# Patient Record
Sex: Male | Born: 1985 | Race: Black or African American | Hispanic: No | Marital: Single | Smoking: Current some day smoker
Health system: Southern US, Community
[De-identification: ages and names within clinical notes are randomized; demographics above are authoritative.]

---

## 2015-09-27 ENCOUNTER — Emergency Department
Admission: EM | Admit: 2015-09-27 | Discharge: 2015-09-27 | Disposition: A | Discharge status: Home / Self Care | Payer: Self-pay | Attending: Emergency Medicine | Admitting: Emergency Medicine

## 2015-09-27 ENCOUNTER — Emergency Department: Payer: Self-pay

## 2015-09-27 ENCOUNTER — Encounter: Payer: Self-pay | Admitting: Emergency Medicine

## 2015-09-27 DIAGNOSIS — F17200 Nicotine dependence, unspecified, uncomplicated: Secondary | ICD-10-CM | POA: Insufficient documentation

## 2015-09-27 DIAGNOSIS — R079 Chest pain, unspecified: Secondary | ICD-10-CM | POA: Insufficient documentation

## 2015-09-27 LAB — COMPREHENSIVE METABOLIC PANEL
ALK PHOS: 75 U/L (ref 38–126)
ALT: 29 U/L (ref 17–63)
AST: 22 U/L (ref 15–41)
Albumin: 3.8 g/dL (ref 3.5–5.0)
Anion gap: 6 (ref 5–15)
BUN: 9 mg/dL (ref 6–20)
CALCIUM: 9.2 mg/dL (ref 8.9–10.3)
CHLORIDE: 101 mmol/L (ref 101–111)
CO2: 29 mmol/L (ref 22–32)
CREATININE: 0.7 mg/dL (ref 0.61–1.24)
Glucose, Bld: 111 mg/dL — ABNORMAL HIGH (ref 65–99)
Potassium: 3.9 mmol/L (ref 3.5–5.1)
Sodium: 136 mmol/L (ref 135–145)
TOTAL PROTEIN: 8.1 g/dL (ref 6.5–8.1)
Total Bilirubin: 0.2 mg/dL — ABNORMAL LOW (ref 0.3–1.2)

## 2015-09-27 LAB — CBC
HCT: 47.9 % (ref 40.0–52.0)
HEMOGLOBIN: 15.4 g/dL (ref 13.0–18.0)
MCH: 26.6 pg (ref 26.0–34.0)
MCHC: 32.1 g/dL (ref 32.0–36.0)
MCV: 82.6 fL (ref 80.0–100.0)
Platelets: 371 10*3/uL (ref 150–440)
RBC: 5.79 MIL/uL (ref 4.40–5.90)
RDW: 14.8 % — ABNORMAL HIGH (ref 11.5–14.5)
WBC: 16.9 10*3/uL — AB (ref 3.8–10.6)

## 2015-09-27 LAB — URINALYSIS COMPLETE WITH MICROSCOPIC (ARMC ONLY)
Bacteria, UA: NONE SEEN
Bilirubin Urine: NEGATIVE
Glucose, UA: NEGATIVE mg/dL
KETONES UR: NEGATIVE mg/dL
Leukocytes, UA: NEGATIVE
NITRITE: NEGATIVE
PROTEIN: NEGATIVE mg/dL
SPECIFIC GRAVITY, URINE: 1.019 (ref 1.005–1.030)
pH: 5 (ref 5.0–8.0)

## 2015-09-27 LAB — FIBRIN DERIVATIVES D-DIMER (ARMC ONLY): FIBRIN DERIVATIVES D-DIMER (ARMC): 565 — AB (ref 0–499)

## 2015-09-27 LAB — LIPASE, BLOOD: LIPASE: 25 U/L (ref 11–51)

## 2015-09-27 MED ORDER — SODIUM CHLORIDE 0.9 % IV BOLUS (SEPSIS)
1000.0000 mL | Freq: Once | INTRAVENOUS | Status: AC
  Administered 2015-09-27: 1000 mL via INTRAVENOUS

## 2015-09-27 MED ORDER — IBUPROFEN 600 MG PO TABS: 600.0000 mg | ORAL_TABLET | Freq: Three times a day (TID) | ORAL | Status: AC | PRN

## 2015-09-27 MED ORDER — IOHEXOL 350 MG/ML SOLN
75.0000 mL | Freq: Once | INTRAVENOUS | Status: AC | PRN
  Administered 2015-09-27: 75 mL via INTRAVENOUS
  Filled 2015-09-27: qty 75

## 2015-09-27 NOTE — ED Notes (Signed)
C/o right lateral to ruq abd pain past several days

## 2015-09-27 NOTE — ED Provider Notes (Signed)
[facility] Emergency Department Provider Note  ____________________________________________  Time seen: 1900  I have reviewed the triage vital signs and the nursing notes.   HISTORY  Chief Complaint Flank Pain   History limited by: Not Limited   HPI Andre KnackRobert Warren Klees Jr. is a 29 y.o. male who presents to the emergency department today because of concerns for right lower chest pain. The patient states that the pain has been going on for roughly 2 weeks. He denies any trauma before the start of the pain. He states that the pain does sort of come and go between days. He states that the pain is not quite sharp in nature. It is only in that one area. It does not radiate. He states that he has never had pain like this before. He denies any productive cough. Denies any fevers.   History reviewed. No pertinent past medical history.  There are no active problems to display for this patient.   History reviewed. No pertinent past surgical history.  No current outpatient prescriptions on file.  Allergies Review of patient's allergies indicates no known allergies.  No family history on file.  Social History Social History  Substance Use Topics  . Smoking status: Current Some Day Smoker  . Smokeless tobacco: None  . Alcohol Use: No    Review of Systems  Constitutional: Negative for fever. Cardiovascular: Positive for right lower chest pain Respiratory: Negative for shortness of breath. Gastrointestinal: Negative for abdominal pain, vomiting and diarrhea. Genitourinary: Negative for dysuria. Musculoskeletal: Negative for back pain. Skin: Negative for rash. Neurological: Negative for headaches, focal weakness or numbness.  10-point ROS otherwise negative.  ____________________________________________   PHYSICAL EXAM:  VITAL SIGNS: ED Triage Vitals  Enc Vitals Group     BP 09/27/15 1732 157/107 mmHg     Pulse Rate 09/27/15 1732 120     Resp  09/27/15 1732 15     Temp 09/27/15 1732 98.7 F (37.1 C)     Temp Source 09/27/15 1732 Oral     SpO2 09/27/15 1732 98 %     Weight 09/27/15 1732 302 lb (136.986 kg)     Height 09/27/15 1732 5\' 7"  (1.702 m)     Head Cir --      Peak Flow --      Pain Score 09/27/15 1746 3   Constitutional: Alert and oriented. Well appearing and in no distress. Eyes: Conjunctivae are normal. PERRL. Normal extraocular movements. ENT   Head: Normocephalic and atraumatic.   Nose: No congestion/rhinnorhea.   Mouth/Throat: Mucous membranes are moist.   Neck: No stridor. Hematological/Lymphatic/Immunilogical: No cervical lymphadenopathy. Cardiovascular: Normal rate, regular rhythm.  No murmurs, rubs, or gallops. Respiratory: Normal respiratory effort without tachypnea nor retractions. Breath sounds are clear and equal bilaterally. No wheezes/rales/rhonchi. Gastrointestinal: Soft and nontender. No distention. There is no CVA tenderness. Genitourinary: Deferred Musculoskeletal: Normal range of motion in all extremities. No joint effusions.  No lower extremity tenderness nor edema. Neurologic:  Normal speech and language. No gross focal neurologic deficits are appreciated.  Skin:  Skin is warm, dry and intact. No rash noted. Psychiatric: Mood and affect are normal. Speech and behavior are normal. Patient exhibits appropriate insight and judgment.  ____________________________________________    LABS (pertinent positives/negatives)  Labs Reviewed  CBC - Abnormal; Notable for the following:    WBC 16.9 (*)    RDW 14.8 (*)    All other components within normal limits  URINALYSIS COMPLETEWITH MICROSCOPIC ([facility] ONLY) - Abnormal; Notable  for the following:    Color, Urine YELLOW (*)    APPearance CLEAR (*)    Hgb urine dipstick 1+ (*)    Squamous Epithelial / LPF 0-5 (*)    All other components within normal limits  COMPREHENSIVE METABOLIC PANEL - Abnormal; Notable for the following:     Glucose, Bld 111 (*)    Total Bilirubin 0.2 (*)    All other components within normal limits  FIBRIN DERIVATIVES D-DIMER ([facility] ONLY) - Abnormal; Notable for the following:    Fibrin derivatives D-dimer ([facility]) 565 (*)    All other components within normal limits  LIPASE, BLOOD     ____________________________________________   EKG  I, Phineas Semen, attending physician, personally viewed and interpreted this EKG  EKG Time: 1739 Rate: 117 Rhythm: sinus tachycardia Axis: normal Intervals: qtc 446 QRS: narrow ST changes: no st elevation Impression: no st elevation ____________________________________________    RADIOLOGY  CT angios chest IMPRESSION: 1. No evidence of significant pulmonary embolus. 2. Lungs essentially clear bilaterally.  ____________________________________________   PROCEDURES  Procedure(s) performed: None  Critical Care performed: No  ____________________________________________   INITIAL IMPRESSION / ASSESSMENT AND PLAN / ED COURSE  Pertinent labs & imaging results that were available during my care of the patient were reviewed by me and considered in my medical decision making (see chart for details).  Patient presented to the emergency department today because of concerns for right lower chest pain. It was pleuritic. I did order a d-dimer with my concern for pulmonary embolism however the CT in general was negative. This point unclear etiology of the pain however given that it is pleuritic I do wonder pleurisy as a component. Patient was completely nontender in the abdomen LFTs and lipase within normal limits, I doubt gallbladder disease. I did discuss with patient that I would like him to be evaluated tomorrow.  ____________________________________________   FINAL CLINICAL IMPRESSION(S) / ED DIAGNOSES  Final diagnoses:  Right-sided chest pain     Phineas Semen, MD 09/28/15 1807

## 2015-09-27 NOTE — Discharge Instructions (Signed)
Please seek medical attention for any high fevers, chest pain, shortness of breath, change in behavior, persistent vomiting, bloody stool or any other new or concerning symptoms. ° ° °Pleurisy °Pleurisy is an inflammation and swelling of the lining of the lungs (pleura). Because of this inflammation, it hurts to breathe. It can be aggravated by coughing, laughing, or deep breathing. Pleurisy is often caused by an underlying infection or disease.  °HOME CARE INSTRUCTIONS  °Monitor your pleurisy for any changes. The following actions may help to alleviate any discomfort you are experiencing: °· Medicine may help with pain. Only take over-the-counter or prescription medicines for pain, discomfort, or fever as directed by your health care provider. °· Only take antibiotic medicine as directed. Make sure to finish it even if you start to feel better. °SEEK MEDICAL CARE IF:  °· Your pain is not controlled with medicine or is increasing. °· You have an increase in pus-like (purulent) secretions brought up with coughing. °SEEK IMMEDIATE MEDICAL CARE IF:  °· You have blue or dark lips, fingernails, or toenails. °· You are coughing up blood. °· You have increased difficulty breathing. °· You have continuing pain unrelieved by medicine or pain lasting more than 1 week. °· You have pain that radiates into your neck, arms, or jaw. °· You develop increased shortness of breath or wheezing. °· You develop a fever, rash, vomiting, fainting, or other serious symptoms. °MAKE SURE YOU: °· Understand these instructions.   °· Will watch your condition.   °· Will get help right away if you are not doing well or get worse. °  °  °This information is not intended to replace advice given to you by your health care provider. Make sure you discuss any questions you have with your health care provider. °  °Document Released: 10/30/2005 Document Revised: 07/02/2013 Document Reviewed: 04/13/2013 °Elsevier Interactive Patient Education ©2016  [employer] ° °

## 2015-09-27 NOTE — ED Notes (Signed)
Pt presents with right side flank pain for two weeks now with no other symptoms.

## 2016-06-19 IMAGING — CT CT ANGIO CHEST
1 of 2 series · 18 of 30 positions shown · IV contrast (APPLIED)
Comparison: Chest radiograph performed earlier today at [DATE] p.m.

CLINICAL DATA: Subacute onset of right-sided chest pain, worse with
cough or deep breathing. Initial encounter.

EXAM:
CT ANGIOGRAPHY CHEST WITH CONTRAST
TECHNIQUE: Multidetector CT imaging of the chest was performed using the
standard protocol during bolus administration of intravenous
contrast. Multiplanar CT image reconstructions and MIPs were
obtained to evaluate the vascular anatomy.
CONTRAST:  75mL OMNIPAQUE IOHEXOL 350 MG/ML SOLN

[Series 5: pe 1.0 thins · axial · 0.76mm/px · z∈[-354,-98]mm · 18 of 288 slices shown]
[im 16/288  lung]
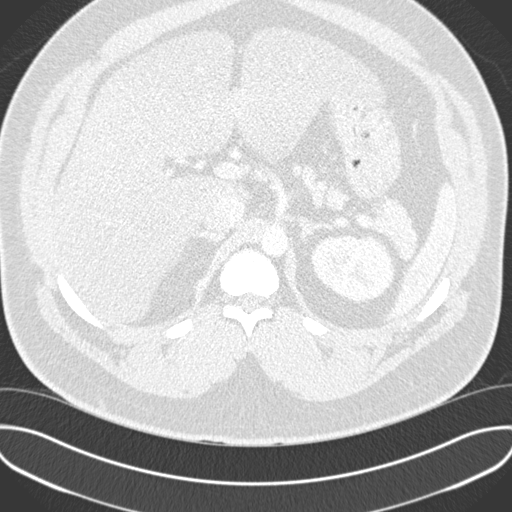
[im 32/288  mediastinal]
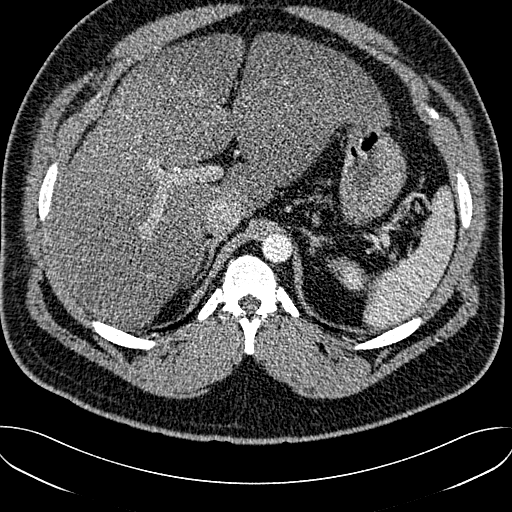
[im 48/288  lung]
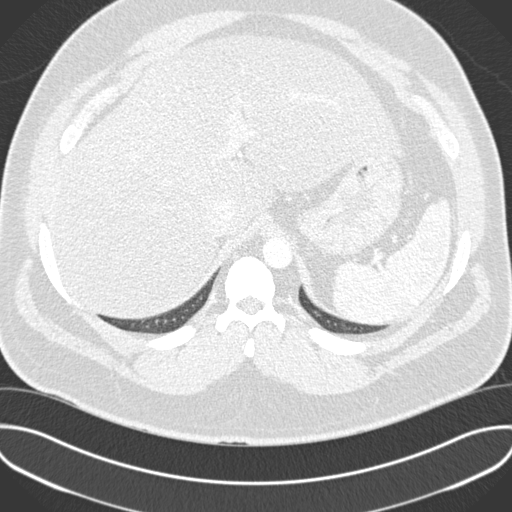
[im 64/288  mediastinal]
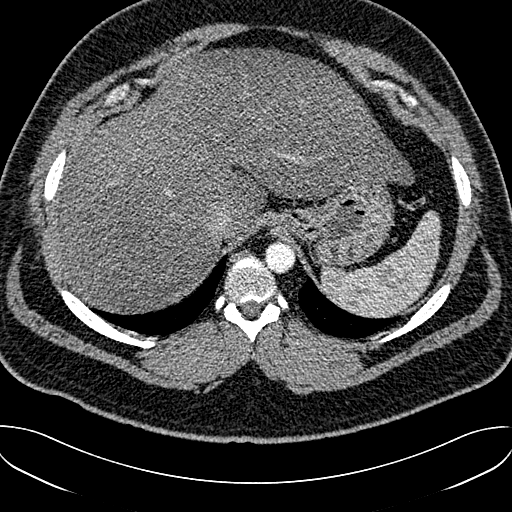
[im 80/288  lung]
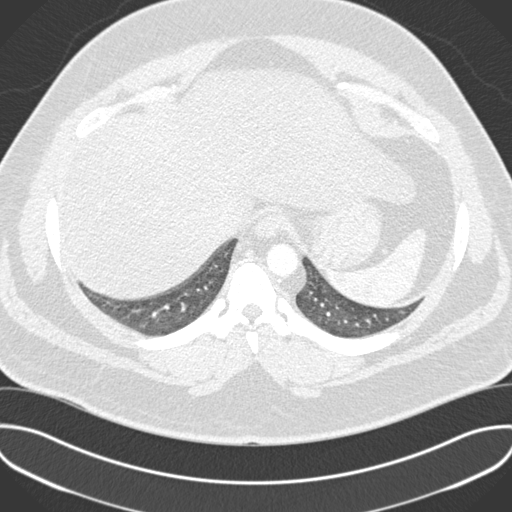
[im 96/288  mediastinal]
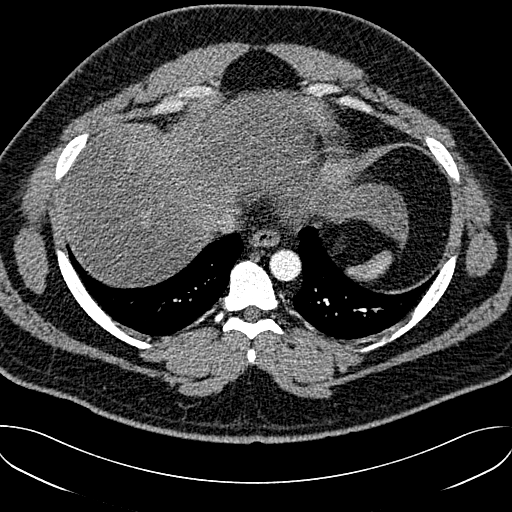
[im 112/288  lung]
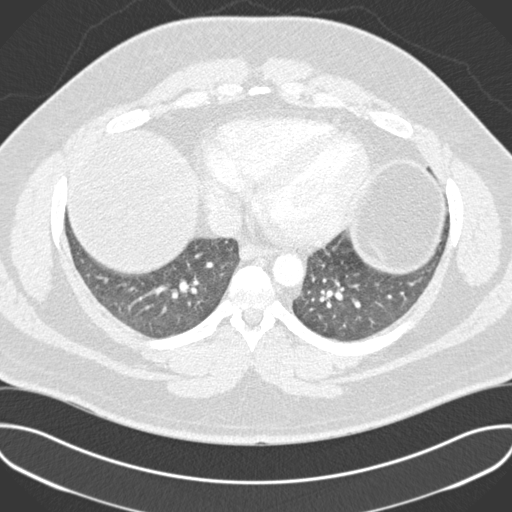
[im 128/288  mediastinal]
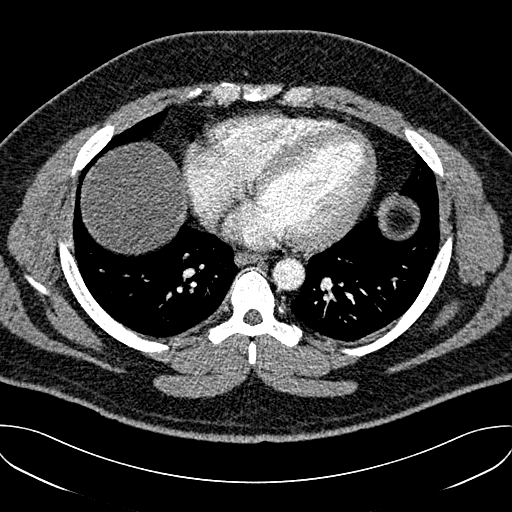
[im 135/288  lung]
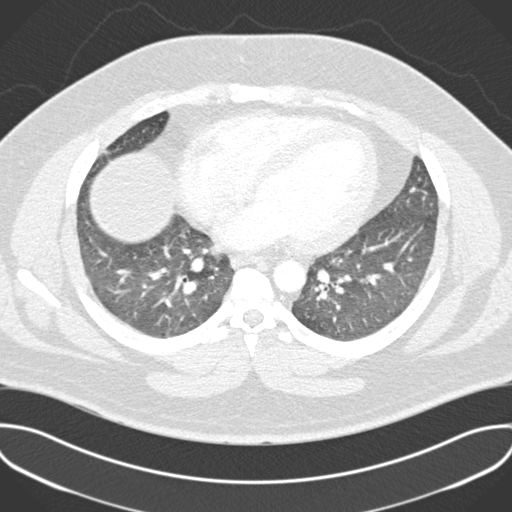
[im 144/288  mediastinal]
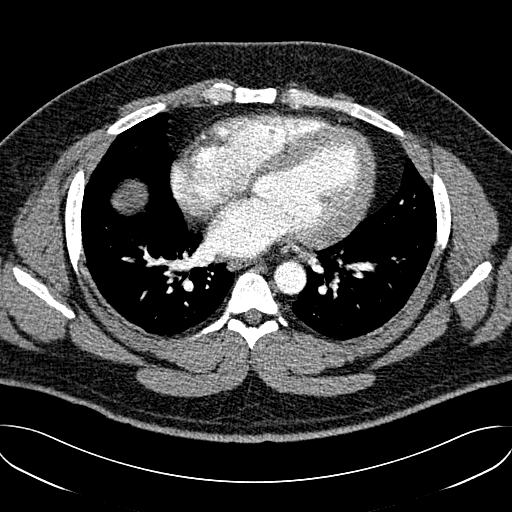
[im 160/288  lung]
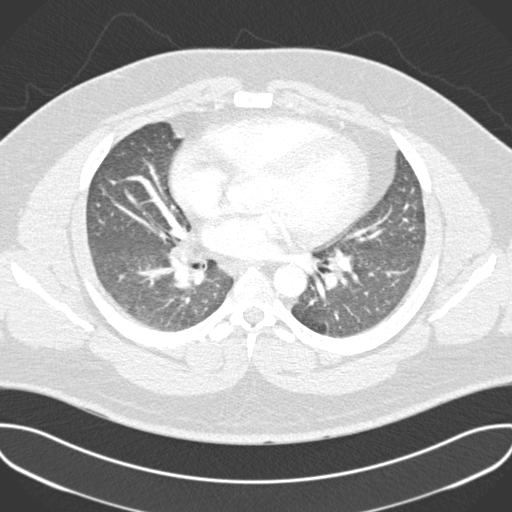
[im 176/288  mediastinal]
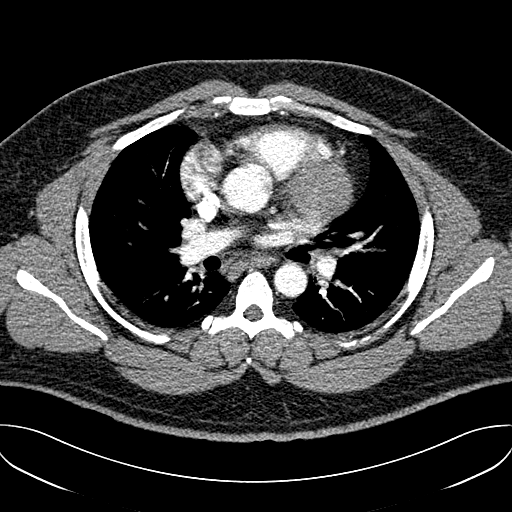
[im 192/288  lung]
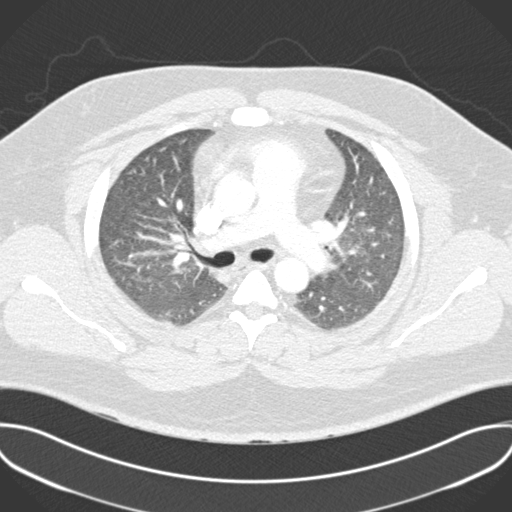
[im 208/288  mediastinal]
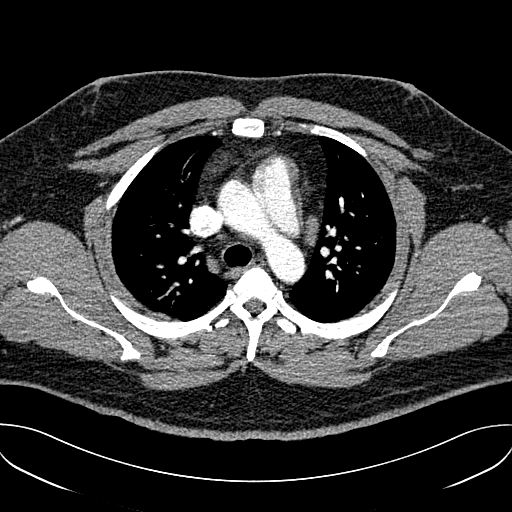
[im 224/288  lung]
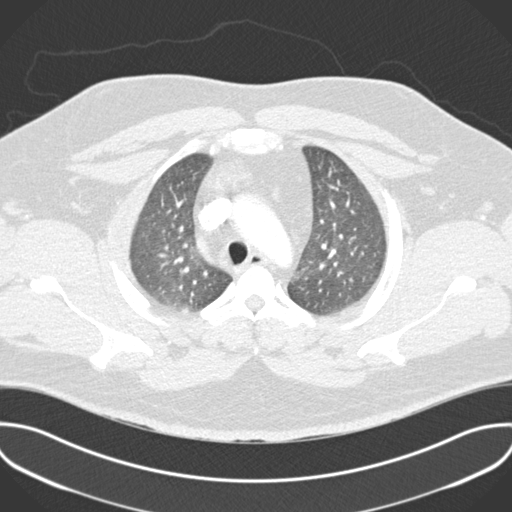
[im 240/288  mediastinal]
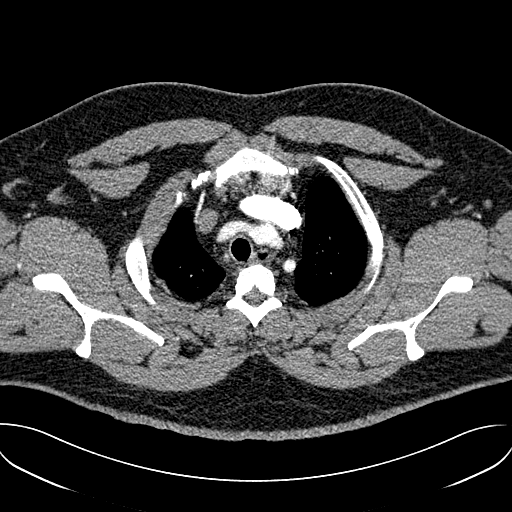
[im 256/288  lung]
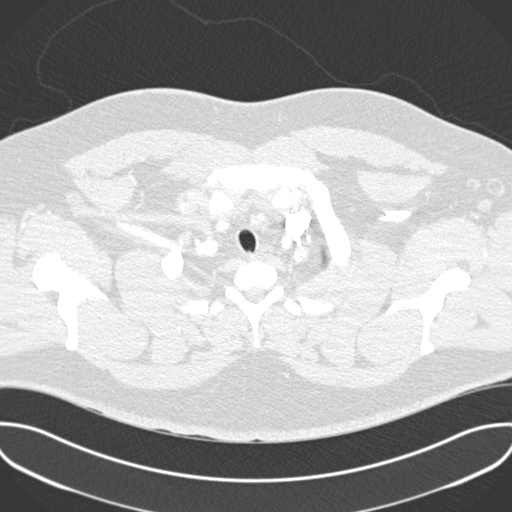
[im 272/288  mediastinal]
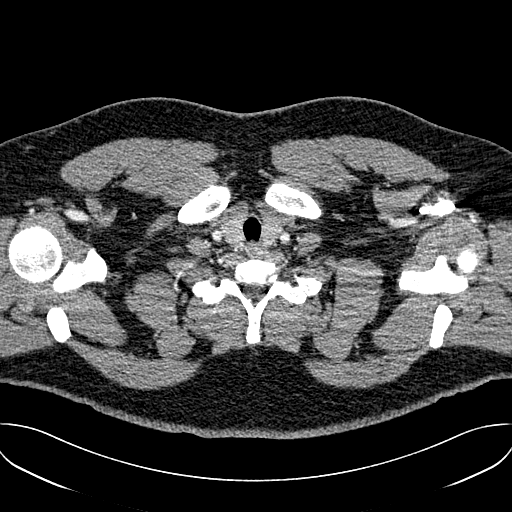

[18 of 30 positions shown; findings below may reference images not displayed]

FINDINGS: There is no evidence of significant pulmonary embolus.

The lungs are essentially clear bilaterally. There is no evidence of
significant focal consolidation, pleural effusion or pneumothorax.
No masses are identified; no abnormal focal contrast enhancement is
seen.

The mediastinum is unremarkable in appearance. No mediastinal
lymphadenopathy is seen. No pericardial effusion is identified. The
great vessels are grossly unremarkable in appearance. No axillary
lymphadenopathy is seen. The visualized portions of the thyroid
gland are unremarkable in appearance.

The visualized portions of the liver and spleen are unremarkable.
The visualized portions of the pancreas, stomach, adrenal glands and
kidneys are within normal limits.

No acute osseous abnormalities are seen.

Review of the MIP images confirms the above findings.
IMPRESSION: 1. No evidence of significant pulmonary embolus.
2. Lungs essentially clear bilaterally.

## 2018-03-19 ENCOUNTER — Emergency Department: Payer: Self-pay

## 2018-03-19 ENCOUNTER — Encounter: Payer: Self-pay | Admitting: Emergency Medicine

## 2018-03-19 ENCOUNTER — Other Ambulatory Visit: Payer: Self-pay

## 2018-03-19 ENCOUNTER — Emergency Department
Admission: EM | Admit: 2018-03-19 | Discharge: 2018-03-19 | Disposition: A | Discharge status: Home / Self Care | Payer: Self-pay | Attending: Student in an Organized Health Care Education/Training Program | Admitting: Student in an Organized Health Care Education/Training Program

## 2018-03-19 DIAGNOSIS — Y9383 Activity, rough housing and horseplay: Secondary | ICD-10-CM | POA: Insufficient documentation

## 2018-03-19 DIAGNOSIS — Y998 Other external cause status: Secondary | ICD-10-CM | POA: Insufficient documentation

## 2018-03-19 DIAGNOSIS — Y929 Unspecified place or not applicable: Secondary | ICD-10-CM | POA: Insufficient documentation

## 2018-03-19 DIAGNOSIS — W228XXA Striking against or struck by other objects, initial encounter: Secondary | ICD-10-CM | POA: Insufficient documentation

## 2018-03-19 DIAGNOSIS — F17200 Nicotine dependence, unspecified, uncomplicated: Secondary | ICD-10-CM | POA: Insufficient documentation

## 2018-03-19 DIAGNOSIS — S62310A Displaced fracture of base of second metacarpal bone, right hand, initial encounter for closed fracture: Secondary | ICD-10-CM | POA: Insufficient documentation

## 2018-03-19 MED ORDER — OXYCODONE-ACETAMINOPHEN 7.5-325 MG PO TABS: 1.0000 | ORAL_TABLET | Freq: Four times a day (QID) | ORAL | 0 refills | Status: AC | PRN

## 2018-03-19 MED ORDER — GABAPENTIN 300 MG PO CAPS
300.0000 mg | ORAL_CAPSULE | Freq: Once | ORAL | Status: DC
  Filled 2018-03-19: qty 1

## 2018-03-19 MED ORDER — IBUPROFEN 800 MG PO TABS
800.0000 mg | ORAL_TABLET | Freq: Once | ORAL | Status: AC
  Administered 2018-03-19: 800 mg via ORAL
  Filled 2018-03-19: qty 1

## 2018-03-19 MED ORDER — IBUPROFEN 800 MG PO TABS: 800.0000 mg | ORAL_TABLET | Freq: Three times a day (TID) | ORAL | 0 refills | Status: AC | PRN

## 2018-03-19 MED ORDER — GABAPENTIN 600 MG PO TABS
ORAL_TABLET | ORAL | Status: AC
  Filled 2018-03-19: qty 1

## 2018-03-19 MED ORDER — OXYCODONE-ACETAMINOPHEN 5-325 MG PO TABS
1.0000 | ORAL_TABLET | Freq: Once | ORAL | Status: AC
  Administered 2018-03-19: 1 via ORAL
  Filled 2018-03-19: qty 1
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
## 2018-03-19 NOTE — Discharge Instructions (Addendum)
Wear splint until evaluation by orthopedics. °
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
## 2018-04-30 ENCOUNTER — Emergency Department: Payer: Self-pay

## 2018-04-30 ENCOUNTER — Encounter: Payer: Self-pay | Admitting: Emergency Medicine

## 2018-04-30 ENCOUNTER — Emergency Department
Admission: EM | Admit: 2018-04-30 | Discharge: 2018-04-30 | Disposition: A | Discharge status: Home / Self Care | Payer: Self-pay | Attending: Emergency Medicine | Admitting: Emergency Medicine

## 2018-04-30 DIAGNOSIS — Z5321 Procedure and treatment not carried out due to patient leaving prior to being seen by health care provider: Secondary | ICD-10-CM | POA: Insufficient documentation

## 2018-04-30 DIAGNOSIS — I10 Essential (primary) hypertension: Secondary | ICD-10-CM | POA: Insufficient documentation

## 2018-04-30 LAB — BASIC METABOLIC PANEL
ANION GAP: 10 (ref 5–15)
BUN: 6 mg/dL (ref 6–20)
CALCIUM: 8.6 mg/dL — AB (ref 8.9–10.3)
CO2: 26 mmol/L (ref 22–32)
Chloride: 106 mmol/L (ref 101–111)
Creatinine, Ser: 0.63 mg/dL (ref 0.61–1.24)
GFR calc non Af Amer: >60 mL/min (ref >60)
Glucose, Bld: 90 mg/dL (ref 65–99)
POTASSIUM: 3.5 mmol/L (ref 3.5–5.1)
Sodium: 142 mmol/L (ref 135–145)

## 2018-04-30 LAB — CBC
HEMATOCRIT: 46.6 % (ref 40.0–52.0)
HEMOGLOBIN: 15.2 g/dL (ref 13.0–18.0)
MCH: 27.4 pg (ref 26.0–34.0)
MCHC: 32.6 g/dL (ref 32.0–36.0)
MCV: 83.9 fL (ref 80.0–100.0)
Platelets: 350 10*3/uL (ref 150–440)
RBC: 5.55 MIL/uL (ref 4.40–5.90)
RDW: 16.2 % — ABNORMAL HIGH (ref 11.5–14.5)
WBC: 16.3 10*3/uL — ABNORMAL HIGH (ref 3.8–10.6)

## 2018-04-30 LAB — TROPONIN I

## 2018-04-30 NOTE — ED Triage Notes (Signed)
Pt at work this evening when he began to feel lightheaded and started to sweat. Pts BP elevated, no prior hx/o. Pts CBG 89. BP 151/98. Pt reports he works in a [employer] and does not drink a lot of water.

## 2018-04-30 NOTE — ED Notes (Signed)
Pt called without response 

## 2018-04-30 NOTE — ED Notes (Signed)
Pt called x 2 with out response

## 2018-05-01 ENCOUNTER — Telehealth: Payer: Self-pay | Admitting: Emergency Medicine

## 2018-05-01 NOTE — Telephone Encounter (Signed)
Called patient due to lwot to inquire about condition and follow up plans. Says he feels better.  I told him he still should see doctor.  He does not have pcp or insurance. I explained [facility] and [facility] as options.  I asked him to at least take bp and write down when he goes in [employer] or drug store so he can take to doctor.  He agrees.
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
